# Patient Record
Sex: Male | Born: 1995 | Race: White | Hispanic: No | Marital: Single | Smoking: Never smoker
Health system: Southern US, Community
[De-identification: ages and names within clinical notes are randomized; demographics above are authoritative.]

---
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
## 2017-08-01 DIAGNOSIS — H6091 Unspecified otitis externa, right ear: Secondary | ICD-10-CM | POA: Diagnosis not present

## 2017-12-01 ENCOUNTER — Encounter (HOSPITAL_COMMUNITY): Payer: Self-pay | Admitting: Emergency Medicine

## 2017-12-01 ENCOUNTER — Ambulatory Visit (HOSPITAL_COMMUNITY)
Admission: EM | Admit: 2017-12-01 | Discharge: 2017-12-01 | Disposition: A | Discharge status: Home / Self Care | Payer: 59 | Attending: Family Medicine | Admitting: Family Medicine

## 2017-12-01 DIAGNOSIS — S0501XA Injury of conjunctiva and corneal abrasion without foreign body, right eye, initial encounter: Secondary | ICD-10-CM

## 2017-12-01 MED ORDER — OFLOXACIN 0.3 % OP SOLN: OPHTHALMIC | 0 refills | Status: AC

## 2017-12-01 MED ORDER — OFLOXACIN 0.3 % OP SOLN: OPHTHALMIC | 0 refills | Status: DC

## 2017-12-01 MED ORDER — POLYETHYL GLYCOL-PROPYL GLYCOL 0.4-0.3 % OP GEL: 1.0000 "application " | Freq: Every evening | OPHTHALMIC | 0 refills | Status: DC | PRN

## 2017-12-01 MED ORDER — POLYETHYL GLYCOL-PROPYL GLYCOL 0.4-0.3 % OP GEL: 1.0000 "application " | Freq: Every evening | OPHTHALMIC | 0 refills | Status: AC | PRN

## 2017-12-01 MED ORDER — TETRACAINE HCL 0.5 % OP SOLN
OPHTHALMIC | Status: AC
  Filled 2017-12-01: qty 4

## 2017-12-01 NOTE — Discharge Instructions (Signed)
Corneal abrasion to the bottom of the eye. Use ofloxacin eyedrops as directed on right eye. Artificial tear gel at night. Wait 10-15 minutes between drops, always use artificial tear gel last, as it prevents drops from penetrating through. Warm compresses as directed. Monitor for any worsening of symptoms, changes in vision, sensitivity to light, eye swelling, painful eye movement, follow up with ophthalmology for further evaluation. If symptoms does not improving in 2-3 days, follow up with the ophthalmologist for reevaluation needed.

## 2017-12-01 NOTE — ED Triage Notes (Signed)
Pt was grinding a piece of metal on his car and felt like something flew into his R eye.

## 2017-12-01 NOTE — ED Provider Notes (Signed)
[facility]    CSN: [id number] Arrival date & time: 12/01/17  1844     History   Chief Complaint Chief Complaint  Patient presents with  . Foreign Body in Eye    HPI Samuel Price is a 22 y.o. male.   22 year old male comes in with mother for possible foreign body in the right eye.  States he was working on the car, grinding a piece of metal when he felt like something flew into his right eye.  States he was wearing protective eyewear at the time, but states it could have been from underneath the protective eyewear.  Wrist eye out with tap water, OTC eyedrops, but still with foreign body sensation.  Eye watering and irritated.  Denies vision changes, photophobia.  Denies contact lens, glasses wear.     History reviewed. No pertinent past medical history.  There are no active problems to display for this patient.   History reviewed. No pertinent surgical history.     Home Medications    Prior to Admission medications   Medication Sig Start Date End Date Taking? Authorizing Provider  ofloxacin (OCUFLOX) 0.3 % ophthalmic solution 1 drop in right eye every 4 hours for the first 2 days, then 4 times a day for the next 5 days. 12/01/17   Cathie HoopsYu, Nishan Ovens V, PA-C  Polyethyl Glycol-Propyl Glycol (SYSTANE) 0.4-0.3 % GEL ophthalmic gel Place 1 application into both eyes at bedtime as needed. 12/01/17   Belinda FisherYu, Tonita Bills V, PA-C    Family History No family history on file.  Social History Social History   Tobacco Use  . Smoking status: Not on file  Substance Use Topics  . Alcohol use: Not on file  . Drug use: Not on file     Allergies   Patient has no known allergies.   Review of Systems Review of Systems  Reason unable to perform ROS: See HPI as above.     Physical Exam Triage Vital Signs ED Triage Vitals [12/01/17 1902]  Enc Vitals Group     BP (!) 122/58     Pulse Rate 65     Resp 16     Temp 98.4 F (36.9 C)     Temp Source Oral     SpO2 100 %     Weight     Height      Head Circumference      Peak Flow      Pain Score      Pain Loc      Pain Edu?      Excl. in GC?    No data found.  Updated Vital Signs BP (!) 122/58 (BP Location: Right Arm)   Pulse 65   Temp 98.4 F (36.9 C) (Oral)   Resp 16   SpO2 100%   Visual Acuity Right Eye Distance:   Left Eye Distance:   Bilateral Distance:    Right Eye Near: R Near: 20/20 Left Eye Near:  L Near: 20/20 Bilateral Near:  20/20  Physical Exam  Constitutional: He is oriented to person, place, and time. He appears well-developed and well-nourished. No distress.  HENT:  Head: Normocephalic and atraumatic.  Eyes: Pupils are equal, round, and reactive to light. EOM and lids are normal. Lids are everted and swept, no foreign bodies found. No foreign body present in the right eye. No foreign body present in the left eye. Right conjunctiva is not injected. Left conjunctiva is not injected.  Fluorescein stain with uptake  to the 6:00 region inferior to the iris approximately, 3 mm.  No foreign body seen.  Neck: Normal range of motion. Neck supple.  Neurological: He is alert and oriented to person, place, and time.  Skin: Skin is warm and dry.     UC Treatments / Results  Labs (all labs ordered are listed, but only abnormal results are displayed) Labs Reviewed - No data to display  EKG None  Radiology No results found.  Procedures Procedures (including critical care time)  Medications Ordered in UC Medications - No data to display  Initial Impression / Assessment and Plan / UC Course  I have reviewed the triage vital signs and the nursing notes.  Pertinent labs & imaging results that were available during my care of the patient were reviewed by me and considered in my medical decision making (see chart for details).    Start ofloxacin as directed.  Artificial tear gel as directed.  Return precautions given.  Patient to follow-up with ophthalmology for reevaluation if symptoms not  improving in 2 to 3 days.  Mother and patient expresses understanding and agrees to plan.  Final Clinical Impressions(s) / UC Diagnoses   Final diagnoses:  Abrasion of right cornea, initial encounter    ED Prescriptions    Medication Sig Dispense Auth. Provider   ofloxacin (OCUFLOX) 0.3 % ophthalmic solution  (Status: Discontinued) 1 drop in right eye every 4 hours for the first 2 days, then 4 times a day for the next 5 days. 5 mL Alyus Mofield V, PA-C   Polyethyl Glycol-Propyl Glycol (SYSTANE) 0.4-0.3 % GEL ophthalmic gel  (Status: Discontinued) Place 1 application into both eyes at bedtime as needed. 1 Bottle Priyanka Causey V, PA-C   ofloxacin (OCUFLOX) 0.3 % ophthalmic solution 1 drop in right eye every 4 hours for the first 2 days, then 4 times a day for the next 5 days. 5 mL Ettore Trebilcock V, PA-C   Polyethyl Glycol-Propyl Glycol (SYSTANE) 0.4-0.3 % GEL ophthalmic gel Place 1 application into both eyes at bedtime as needed. 1 Bottle Threasa Alpha, [place] 12/01/17 1930

## 2017-12-05 DIAGNOSIS — H6121 Impacted cerumen, right ear: Secondary | ICD-10-CM | POA: Diagnosis not present

## 2017-12-05 DIAGNOSIS — J3089 Other allergic rhinitis: Secondary | ICD-10-CM | POA: Diagnosis not present

## 2017-12-05 DIAGNOSIS — H9201 Otalgia, right ear: Secondary | ICD-10-CM | POA: Diagnosis not present

## 2018-02-04 DIAGNOSIS — Z13220 Encounter for screening for lipoid disorders: Secondary | ICD-10-CM | POA: Diagnosis not present

## 2018-02-04 DIAGNOSIS — Z0000 Encounter for general adult medical examination without abnormal findings: Secondary | ICD-10-CM | POA: Diagnosis not present

## 2018-02-04 DIAGNOSIS — Z131 Encounter for screening for diabetes mellitus: Secondary | ICD-10-CM | POA: Diagnosis not present

## 2018-08-08 DIAGNOSIS — J309 Allergic rhinitis, unspecified: Secondary | ICD-10-CM | POA: Diagnosis not present

## 2018-11-25 DIAGNOSIS — H60331 Swimmer's ear, right ear: Secondary | ICD-10-CM | POA: Diagnosis not present

## 2019-01-02 DIAGNOSIS — M8920 Other disorders of bone development and growth, unspecified site: Secondary | ICD-10-CM | POA: Diagnosis not present

## 2019-01-02 DIAGNOSIS — R6884 Jaw pain: Secondary | ICD-10-CM | POA: Diagnosis not present

## 2019-01-06 DIAGNOSIS — K030 Excessive attrition of teeth: Secondary | ICD-10-CM | POA: Diagnosis not present

## 2019-01-06 DIAGNOSIS — M2629 Other anomalies of dental arch relationship: Secondary | ICD-10-CM | POA: Diagnosis not present

## 2019-01-06 DIAGNOSIS — Z01812 Encounter for preprocedural laboratory examination: Secondary | ICD-10-CM | POA: Diagnosis not present

## 2019-01-06 DIAGNOSIS — M26623 Arthralgia of bilateral temporomandibular joint: Secondary | ICD-10-CM | POA: Diagnosis not present

## 2019-01-06 DIAGNOSIS — Z1159 Encounter for screening for other viral diseases: Secondary | ICD-10-CM | POA: Diagnosis not present

## 2019-01-06 DIAGNOSIS — R4702 Dysphasia: Secondary | ICD-10-CM | POA: Diagnosis not present

## 2019-01-06 DIAGNOSIS — M26213 Malocclusion, Angle's class III: Secondary | ICD-10-CM | POA: Diagnosis not present

## 2019-01-12 DIAGNOSIS — M26609 Unspecified temporomandibular joint disorder, unspecified side: Secondary | ICD-10-CM | POA: Diagnosis not present

## 2019-01-12 DIAGNOSIS — M2629 Other anomalies of dental arch relationship: Secondary | ICD-10-CM | POA: Diagnosis not present

## 2019-01-12 DIAGNOSIS — J342 Deviated nasal septum: Secondary | ICD-10-CM | POA: Diagnosis not present

## 2019-01-12 DIAGNOSIS — J343 Hypertrophy of nasal turbinates: Secondary | ICD-10-CM | POA: Diagnosis not present

## 2019-01-12 DIAGNOSIS — R4702 Dysphasia: Secondary | ICD-10-CM | POA: Diagnosis not present

## 2019-01-12 DIAGNOSIS — M26623 Arthralgia of bilateral temporomandibular joint: Secondary | ICD-10-CM | POA: Diagnosis not present

## 2019-01-12 DIAGNOSIS — M2602 Maxillary hypoplasia: Secondary | ICD-10-CM | POA: Diagnosis not present

## 2019-01-12 DIAGNOSIS — M26603 Bilateral temporomandibular joint disorder, unspecified: Secondary | ICD-10-CM | POA: Diagnosis not present

## 2019-02-04 DIAGNOSIS — Z0000 Encounter for general adult medical examination without abnormal findings: Secondary | ICD-10-CM | POA: Diagnosis not present

## 2019-03-23 DIAGNOSIS — J309 Allergic rhinitis, unspecified: Secondary | ICD-10-CM | POA: Diagnosis not present

## 2019-04-29 DIAGNOSIS — S0500XA Injury of conjunctiva and corneal abrasion without foreign body, unspecified eye, initial encounter: Secondary | ICD-10-CM | POA: Diagnosis not present

## 2019-05-07 DIAGNOSIS — H6121 Impacted cerumen, right ear: Secondary | ICD-10-CM | POA: Diagnosis not present

## 2019-05-07 DIAGNOSIS — H9201 Otalgia, right ear: Secondary | ICD-10-CM | POA: Diagnosis not present

## 2019-05-07 DIAGNOSIS — Z7289 Other problems related to lifestyle: Secondary | ICD-10-CM | POA: Diagnosis not present

## 2021-08-11 ENCOUNTER — Encounter (HOSPITAL_COMMUNITY): Payer: Self-pay | Admitting: Emergency Medicine

## 2021-08-11 ENCOUNTER — Emergency Department (HOSPITAL_COMMUNITY): Payer: Commercial Managed Care - HMO

## 2021-08-11 ENCOUNTER — Emergency Department (HOSPITAL_COMMUNITY)
Admission: EM | Admit: 2021-08-11 | Discharge: 2021-08-11 | Disposition: A | Discharge status: Home / Self Care | Payer: Commercial Managed Care - HMO | Attending: Emergency Medicine | Admitting: Emergency Medicine

## 2021-08-11 DIAGNOSIS — R079 Chest pain, unspecified: Secondary | ICD-10-CM | POA: Insufficient documentation

## 2021-08-11 DIAGNOSIS — R0981 Nasal congestion: Secondary | ICD-10-CM | POA: Insufficient documentation

## 2021-08-11 DIAGNOSIS — R0982 Postnasal drip: Secondary | ICD-10-CM | POA: Insufficient documentation

## 2021-08-11 DIAGNOSIS — Z20822 Contact with and (suspected) exposure to covid-19: Secondary | ICD-10-CM | POA: Insufficient documentation

## 2021-08-11 DIAGNOSIS — J40 Bronchitis, not specified as acute or chronic: Secondary | ICD-10-CM | POA: Insufficient documentation

## 2021-08-11 DIAGNOSIS — R0602 Shortness of breath: Secondary | ICD-10-CM | POA: Insufficient documentation

## 2021-08-11 DIAGNOSIS — R059 Cough, unspecified: Secondary | ICD-10-CM | POA: Diagnosis present

## 2021-08-11 DIAGNOSIS — J329 Chronic sinusitis, unspecified: Secondary | ICD-10-CM | POA: Insufficient documentation

## 2021-08-11 LAB — HEPATIC FUNCTION PANEL
ALT: 17 U/L (ref 0–44)
AST: 19 U/L (ref 15–41)
Albumin: 4.5 g/dL (ref 3.5–5.0)
Alkaline Phosphatase: 63 U/L (ref 38–126)
Bilirubin, Direct: <0.1 mg/dL (ref 0.0–0.2)
Total Bilirubin: 0.5 mg/dL (ref 0.3–1.2)
Total Protein: 7.5 g/dL (ref 6.5–8.1)

## 2021-08-11 LAB — RESPIRATORY PANEL BY PCR

## 2021-08-11 LAB — CBC
HCT: 43.6 % (ref 39.0–52.0)
Hemoglobin: 14.2 g/dL (ref 13.0–17.0)
MCH: 28.6 pg (ref 26.0–34.0)
MCHC: 32.6 g/dL (ref 30.0–36.0)
MCV: 87.7 fL (ref 80.0–100.0)
Platelets: 188 10*3/uL (ref 150–400)
RBC: 4.97 MIL/uL (ref 4.22–5.81)
RDW: 12.6 % (ref 11.5–15.5)
WBC: 5 10*3/uL (ref 4.0–10.5)
nRBC: 0 % (ref 0.0–0.2)

## 2021-08-11 LAB — BASIC METABOLIC PANEL
Anion gap: 10 (ref 5–15)
BUN: 13 mg/dL (ref 6–20)
CO2: 23 mmol/L (ref 22–32)
Calcium: 9.4 mg/dL (ref 8.9–10.3)
Chloride: 103 mmol/L (ref 98–111)
Creatinine, Ser: 0.99 mg/dL (ref 0.61–1.24)
GFR, Estimated: >60 mL/min (ref >60)
Glucose, Bld: 105 mg/dL — ABNORMAL HIGH (ref 70–99)
Potassium: 4.1 mmol/L (ref 3.5–5.1)
Sodium: 136 mmol/L (ref 135–145)

## 2021-08-11 LAB — RESP PANEL BY RT-PCR (FLU A&B, COVID) ARPGX2
Influenza A by PCR: NEGATIVE
Influenza B by PCR: NEGATIVE
SARS Coronavirus 2 by RT PCR: NEGATIVE

## 2021-08-11 LAB — LIPASE, BLOOD: Lipase: 43 U/L (ref 11–51)

## 2021-08-11 LAB — TROPONIN I (HIGH SENSITIVITY)
Troponin I (High Sensitivity): 4 ng/L (ref <18)
Troponin I (High Sensitivity): 3 ng/L (ref <18)

## 2021-08-11 MED ORDER — ALBUTEROL SULFATE HFA 108 (90 BASE) MCG/ACT IN AERS
2.0000 | INHALATION_SPRAY | Freq: Once | RESPIRATORY_TRACT | Status: AC
  Administered 2021-08-11: 2 via RESPIRATORY_TRACT
  Filled 2021-08-11: qty 6.7

## 2021-08-11 NOTE — Discharge Instructions (Signed)
You likely have bronchitis.  Please use albuterol 2 puffs every 4 hours as needed for cough and shortness of breath  See your doctor for follow-up  We swabbed you for respiratory viral panel that can take several days to come back  Return to ER if you have worse shortness of breath, chest pain.

## 2021-08-11 NOTE — ED Notes (Signed)
Pt verbalized understanding of d/c instructions, meds, and followup care. Denies questions. VSS, no distress noted. Steady gait to exit with all belongings.  ?

## 2021-08-11 NOTE — ED Provider Notes (Signed)
[facility] Provider Note   CSN: [id number] Arrival date & time: 08/11/21  1057     History  No chief complaint on file.   Samuel Price is a 26 y.o. male here presenting with persistent cough, postnasal drip, chest pain.  Patient states that symptoms going on for about 3 weeks.  Patient started has some nasal congestion and postnasal drip.  Patient states that he has been out of her chest pressure and some subjective shortness of breath for several weeks now.  Patient was just started on amoxicillin for sinusitis  The history is provided by the patient.      Home Medications Prior to Admission medications   Medication Sig Start Date End Date Taking? Authorizing Provider  ofloxacin (OCUFLOX) 0.3 % ophthalmic solution 1 drop in right eye every 4 hours for the first 2 days, then 4 times a day for the next 5 days. 12/01/17   Tasia Catchings, Amy V, PA-C  Polyethyl Glycol-Propyl Glycol (SYSTANE) 0.4-0.3 % GEL ophthalmic gel Place 1 application into both eyes at bedtime as needed. 12/01/17   Ok Edwards, PA-C      Allergies    Patient has no known allergies.    Review of Systems   Review of Systems  Respiratory:  Positive for shortness of breath.   All other systems reviewed and are negative.  Physical Exam Updated Vital Signs BP 122/79 (BP Location: Right Arm)    Pulse 66    Temp (!) 97.5 F (36.4 C) (Oral)    Resp 19    Ht 6' (1.829 m)    Wt 88.9 kg    SpO2 99%    BMI 26.58 kg/m  Physical Exam Vitals and nursing note reviewed.  Constitutional:      Appearance: Normal appearance.  HENT:     Head: Normocephalic.     Nose: Nose normal.     Mouth/Throat:     Mouth: Mucous membranes are moist.  Eyes:     Extraocular Movements: Extraocular movements intact.     Pupils: Pupils are equal, round, and reactive to light.  Cardiovascular:     Rate and Rhythm: Normal rate and regular rhythm.     Pulses: Normal pulses.     Heart sounds: Normal heart sounds.   Pulmonary:     Effort: Pulmonary effort is normal.     Comments: Diminished bilaterally.  No obvious wheezing Abdominal:     General: Abdomen is flat.     Palpations: Abdomen is soft.  Musculoskeletal:        General: Normal range of motion.     Cervical back: Normal range of motion and neck supple.  Skin:    General: Skin is warm.     Capillary Refill: Capillary refill takes less than 2 seconds.  Neurological:     General: No focal deficit present.     Mental Status: He is alert and oriented to person, place, and time.  Psychiatric:        Mood and Affect: Mood normal.        Behavior: Behavior normal.    ED Results / Procedures / Treatments   Labs (all labs ordered are listed, but only abnormal results are displayed) Labs Reviewed  BASIC METABOLIC PANEL - Abnormal; Notable for the following components:      Result Value   Glucose, Bld 105 (*)    All other components within normal limits  RESP PANEL BY RT-PCR (FLU A&B, COVID)  ARPGX2  RESPIRATORY PANEL BY PCR  CBC  LIPASE, BLOOD  HEPATIC FUNCTION PANEL  TROPONIN I (HIGH SENSITIVITY)  TROPONIN I (HIGH SENSITIVITY)    EKG EKG Interpretation  Date/Time:  Friday August 11 2021 11:22:47 EST Ventricular Rate:  69 PR Interval:  144 QRS Duration: 90 QT Interval:  378 QTC Calculation: 405 R Axis:   76 Text Interpretation: Normal sinus rhythm Normal ECG When compared with ECG of 23-Feb-2008 08:31, PREVIOUS ECG IS PRESENT No significant change since last tracing Confirmed by Wandra Arthurs [phone]) on 08/11/2021 5:23:54 PM  Radiology DG Chest 1 View  Result Date: 08/11/2021 CLINICAL DATA:  Chest tightness. EXAM: CHEST  1 VIEW COMPARISON:  None. FINDINGS: No consolidation. No visible pleural effusions or pneumothorax. Cardiomediastinal silhouette is within normal limits. No evidence of acute osseous abnormality. IMPRESSION: No evidence of acute cardiopulmonary disease. Electronically Signed   By: Margaretha Sheffield M.D.   On:  08/11/2021 11:52    Procedures Procedures    Medications Ordered in ED Medications  albuterol (VENTOLIN HFA) 108 (90 Base) MCG/ACT inhaler 2 puff (2 puffs Inhalation Given 08/11/21 1749)    ED Course/ Medical Decision Making/ A&P                           Medical Decision Making KENDYN PALER is a 26 y.o. male here presenting with chest pain and shortness of breath for several weeks.  Patient has viral syndrome and chills for several weeks.  Patient sent in for rule out ACS. Patient is low risk for ACS.  I think likely bronchitis.  Plan to get CBC and CMP and COVID test flu test and chest x-ray and troponin x 2.   6:01 PM Labs unremarkable.  Troponin negative x2. CXR clear.  COVID test is negative.  Given albuterol for bronchitis.  Patient is swabbed for respiratory panel.    Problems Addressed: Chest pain: acute illness or injury  Amount and/or Complexity of Data Reviewed Labs: ordered. Decision-making details documented in ED Course. Radiology: ordered and independent interpretation performed. Decision-making details documented in ED Course. ECG/medicine tests: ordered and independent interpretation performed. Decision-making details documented in ED Course.  Risk Prescription drug management.  Final Clinical Impression(s) / ED Diagnoses Final diagnoses:  Chest pain    Rx / DC Orders ED Discharge Orders     None         Drenda Freeze, MD 08/11/21 [phone]

## 2021-08-11 NOTE — ED Triage Notes (Signed)
Pt here with c/o chest pain sent drom md office for an abnormal ekg , pt has not feeling well for about  3 weeks and started on antibiotics for a sinus infection

## 2021-08-11 NOTE — ED Provider Triage Note (Signed)
Emergency Medicine Provider Triage Evaluation Note  Samuel Price , a 26 y.o. male  was evaluated in triage.  Pt complains of chest pain.  Patient states that about 3 weeks ago he started developing some nasal congestion.  The symptoms were persistent.  He was seen by his primary care doctor about 2 days ago and was placed on amoxicillin for a sinus infection.  Patient states that on Monday, he started noticing some chest pain.  He said the chest pains in the center of his chest.  It does not radiate.  It feels throbbing.  It tends to come and go.  Episodes last about 10 to 20 minutes and then they get better.  It is not associated with exertion as patient is mostly been lying in bed for the past 3 weeks.  He says that he does have associated shortness of breath with the chest pain.  He has not had any fevers.  He presented again to his primary care provider today.  They obtained his EKG at that time, and apparently was told to have some EKG changes.  He was sent over here to be evaluated for pericarditis given recent viral infection versus ACS.  Review of Systems  Positive: Chest pain, shortness of breath Negative:   Physical Exam  BP 129/82 (BP Location: Right Arm)    Pulse 68    Temp 98.9 F (37.2 C) (Oral)    Resp 20    SpO2 99%  Gen:   Awake, no distress   Resp:  Normal effort  MSK:   Moves extremities without difficulty  Other:    Medical Decision Making  Medically screening exam initiated at 11:31 AM.  Appropriate orders placed.  Samuel Price was informed that the remainder of the evaluation will be completed by another provider, this initial triage assessment does not replace that evaluation, and the importance of remaining in the ED until their evaluation is complete.  CP workup initiated   Claudie Leach, PA-C 08/11/21 1134

## 2022-06-27 ENCOUNTER — Other Ambulatory Visit (HOSPITAL_BASED_OUTPATIENT_CLINIC_OR_DEPARTMENT_OTHER): Payer: Self-pay

## 2022-06-27 DIAGNOSIS — J0100 Acute maxillary sinusitis, unspecified: Secondary | ICD-10-CM | POA: Diagnosis not present

## 2022-06-27 MED ORDER — AMOXICILLIN-POT CLAVULANATE 875-125 MG PO TABS
1.0000 | ORAL_TABLET | Freq: Two times a day (BID) | ORAL | 0 refills | Status: AC
  Filled 2022-06-27: qty 20, 10d supply, fill #0

## 2022-11-16 ENCOUNTER — Other Ambulatory Visit (HOSPITAL_BASED_OUTPATIENT_CLINIC_OR_DEPARTMENT_OTHER): Payer: Self-pay

## 2022-11-16 ENCOUNTER — Ambulatory Visit
Admission: RE | Admit: 2022-11-16 | Discharge: 2022-11-16 | Disposition: A | Discharge status: Home / Self Care | Payer: 59 | Source: Ambulatory Visit | Attending: Urgent Care | Admitting: Urgent Care

## 2022-11-16 VITALS — BP 104/66 | HR 89 | Temp 98.8°F | Resp 20

## 2022-11-16 DIAGNOSIS — J309 Allergic rhinitis, unspecified: Secondary | ICD-10-CM

## 2022-11-16 DIAGNOSIS — B9789 Other viral agents as the cause of diseases classified elsewhere: Secondary | ICD-10-CM | POA: Diagnosis not present

## 2022-11-16 DIAGNOSIS — J988 Other specified respiratory disorders: Secondary | ICD-10-CM

## 2022-11-16 MED ORDER — IBUPROFEN 600 MG PO TABS
600.0000 mg | ORAL_TABLET | Freq: Four times a day (QID) | ORAL | 0 refills | Status: AC | PRN
  Filled 2022-11-16: qty 30, 8d supply, fill #0

## 2022-11-16 MED ORDER — CETIRIZINE HCL 10 MG PO TABS
10.0000 mg | ORAL_TABLET | Freq: Every day | ORAL | 0 refills | Status: AC
  Filled 2022-11-16: qty 30, 30d supply, fill #0

## 2022-11-16 MED ORDER — PSEUDOEPHEDRINE HCL 60 MG PO TABS
60.0000 mg | ORAL_TABLET | Freq: Three times a day (TID) | ORAL | 0 refills | Status: AC | PRN
  Filled 2022-11-16: qty 30, 10d supply, fill #0

## 2022-11-16 NOTE — Discharge Instructions (Signed)
We will manage this as a viral illness. For sore throat or cough try using a honey-based tea. Use 3 teaspoons of honey with juice squeezed from half lemon. Place shaved pieces of ginger into 1/2-1 cup of water and warm over stove top. Then mix the ingredients and repeat every 4 hours as needed. Please take ibuprofen 600mg every 6 hours with food alternating with OR taken together with Tylenol 500mg-650mg every 6 hours for throat pain, fevers, aches and pains. Hydrate very well with at least 2 liters of water. Eat light meals such as soups (chicken and noodles, vegetable, chicken and wild rice).  Do not eat foods that you are allergic to.  Taking an antihistamine like Zyrtec (10mg daily) can help against postnasal drainage, sinus congestion which can cause sinus pain, sinus headaches, throat pain, painful swallowing, coughing.  You can take this together with pseudoephedrine (Sudafed) at a dose of 60 mg 3 times a day or twice daily as needed for the same kind of nasal drip, congestion.  

## 2022-11-16 NOTE — ED Provider Notes (Addendum)
[facility]  Note:  This document was prepared using Conservation officer, historic buildings and may include unintentional dictation errors.  MRN: [id number] DOB: 08/14/1995  Subjective:   Samuel Price is a 27 y.o. male presenting for 4 day history of acute onset sinus congestion, fever, worse in the past 2 days. Has a scratchy throat, body aches. Denies cough, chest pain, shob, wheezing, rashes. No asthma. No smoking of any kind including cigarettes, cigars, vaping, marijuana use.  Did a COVID test at home, was negative.   No current facility-administered medications for this encounter.  Current Outpatient Medications:    ofloxacin (OCUFLOX) 0.3 % ophthalmic solution, 1 drop in right eye every 4 hours for the first 2 days, then 4 times a day for the next 5 days., Disp: 5 mL, Rfl: 0   Polyethyl Glycol-Propyl Glycol (SYSTANE) 0.4-0.3 % GEL ophthalmic gel, Place 1 application into both eyes at bedtime as needed., Disp: 1 Bottle, Rfl: 0   No Known Allergies  History reviewed. No pertinent past medical history.   History reviewed. No pertinent surgical history.  No family history on file.  Social History   Tobacco Use   Smoking status: Never   Smokeless tobacco: Never  Vaping Use   Vaping Use: Never used  Substance Use Topics   Alcohol use: Yes    Comment: occ   Drug use: Never    ROS   Objective:   Vitals: BP 104/66 (BP Location: Right Arm)   Pulse 89   Temp 98.8 F (37.1 C)   Resp 20   SpO2 97%   Physical Exam Constitutional:      General: He is not in acute distress.    Appearance: Normal appearance. He is well-developed and normal weight. He is not ill-appearing, toxic-appearing or diaphoretic.  HENT:     Head: Normocephalic and atraumatic.     Right Ear: Tympanic membrane, ear canal and external ear normal. No drainage, swelling or tenderness. No middle ear effusion. There is no impacted cerumen. Tympanic membrane is not erythematous or  bulging.     Left Ear: Tympanic membrane, ear canal and external ear normal. No drainage, swelling or tenderness.  No middle ear effusion. There is no impacted cerumen. Tympanic membrane is not erythematous or bulging.     Nose: Congestion present. No rhinorrhea.     Mouth/Throat:     Mouth: Mucous membranes are moist.     Pharynx: No oropharyngeal exudate or posterior oropharyngeal erythema.  Eyes:     General: No scleral icterus.       Right eye: No discharge.        Left eye: No discharge.     Extraocular Movements: Extraocular movements intact.     Conjunctiva/sclera: Conjunctivae normal.  Cardiovascular:     Rate and Rhythm: Normal rate and regular rhythm.     Heart sounds: Normal heart sounds. No murmur heard.    No friction rub. No gallop.  Pulmonary:     Effort: Pulmonary effort is normal. No respiratory distress.     Breath sounds: Normal breath sounds. No stridor. No wheezing, rhonchi or rales.  Musculoskeletal:     Cervical back: Normal range of motion and neck supple. No rigidity. No muscular tenderness.  Neurological:     General: No focal deficit present.     Mental Status: He is alert and oriented to person, place, and time.  Psychiatric:        Mood and Affect: Mood  normal.        Behavior: Behavior normal.        Thought Content: Thought content normal.     Assessment and Plan :   PDMP not reviewed this encounter.  1. Viral respiratory illness   2. Allergic rhinitis, unspecified seasonality, unspecified trigger    Declined a COVID test.  Deferred imaging given clear cardiopulmonary exam, hemodynamically stable vital signs. Suspect viral URI, viral syndrome. Physical exam findings reassuring and vital signs stable for discharge. Advised supportive care, offered symptomatic relief. Counseled patient on potential for adverse effects with medications prescribed/recommended today, ER and return-to-clinic precautions discussed, patient verbalized understanding.      Wallis Bamberg, [place] 11/16/22 1408

## 2022-11-16 NOTE — ED Triage Notes (Signed)
Pt c/o head congestion x 4 days-fever last night-last dose tylenol ~9am-NAD-steady gait

## 2023-02-01 ENCOUNTER — Other Ambulatory Visit (HOSPITAL_BASED_OUTPATIENT_CLINIC_OR_DEPARTMENT_OTHER): Payer: Self-pay

## 2023-02-01 DIAGNOSIS — R590 Localized enlarged lymph nodes: Secondary | ICD-10-CM | POA: Diagnosis not present

## 2023-02-01 MED ORDER — AMOXICILLIN 875 MG PO TABS
875.0000 mg | ORAL_TABLET | Freq: Two times a day (BID) | ORAL | 0 refills | Status: AC
  Filled 2023-02-01: qty 14, 7d supply, fill #0

## 2023-02-05 ENCOUNTER — Other Ambulatory Visit (HOSPITAL_BASED_OUTPATIENT_CLINIC_OR_DEPARTMENT_OTHER): Payer: Self-pay

## 2023-03-20 DIAGNOSIS — Z13220 Encounter for screening for lipoid disorders: Secondary | ICD-10-CM | POA: Diagnosis not present

## 2023-03-20 DIAGNOSIS — Z0000 Encounter for general adult medical examination without abnormal findings: Secondary | ICD-10-CM | POA: Diagnosis not present

## 2023-03-20 DIAGNOSIS — H6593 Unspecified nonsuppurative otitis media, bilateral: Secondary | ICD-10-CM | POA: Diagnosis not present

## 2023-03-20 DIAGNOSIS — Z9109 Other allergy status, other than to drugs and biological substances: Secondary | ICD-10-CM | POA: Diagnosis not present

## 2023-05-21 ENCOUNTER — Other Ambulatory Visit (HOSPITAL_BASED_OUTPATIENT_CLINIC_OR_DEPARTMENT_OTHER): Payer: Self-pay

## 2023-05-21 ENCOUNTER — Ambulatory Visit
Admission: EM | Admit: 2023-05-21 | Discharge: 2023-05-21 | Disposition: A | Discharge status: Home / Self Care | Payer: 59 | Attending: Internal Medicine | Admitting: Internal Medicine

## 2023-05-21 DIAGNOSIS — B349 Viral infection, unspecified: Secondary | ICD-10-CM | POA: Diagnosis not present

## 2023-05-21 DIAGNOSIS — R051 Acute cough: Secondary | ICD-10-CM | POA: Diagnosis not present

## 2023-05-21 DIAGNOSIS — J029 Acute pharyngitis, unspecified: Secondary | ICD-10-CM | POA: Insufficient documentation

## 2023-05-21 LAB — POC COVID19/FLU A&B COMBO
Covid Antigen, POC: NEGATIVE
Influenza A Antigen, POC: NEGATIVE
Influenza B Antigen, POC: NEGATIVE

## 2023-05-21 LAB — POCT RAPID STREP A (OFFICE): Rapid Strep A Screen: NEGATIVE

## 2023-05-21 MED ORDER — BENZONATATE 200 MG PO CAPS
200.0000 mg | ORAL_CAPSULE | Freq: Three times a day (TID) | ORAL | 0 refills | Status: AC | PRN
  Filled 2023-05-21: qty 20, 7d supply, fill #0

## 2023-05-21 NOTE — ED Triage Notes (Signed)
Pt presents with c/o nasal congestion, scratchy throat, bilateral ear pressure, X 3 days.   denies cough.   Home interventions- Nyquil/Dayquil

## 2023-05-21 NOTE — Discharge Instructions (Signed)
The clinic will contact you with results of the strep throat culture done today if positive.  You may take Tessalon as needed for cough.  Lots of rest and fluids.  Please follow-up with your PCP if your symptoms do not improve.  Please go to the ER for any worsening symptoms.  I hope you feel better soon!

## 2023-05-21 NOTE — ED Provider Notes (Signed)
[facility]    CSN: [id number] Arrival date & time: 05/21/23  1240      History   Chief Complaint Chief Complaint  Patient presents with   Nasal Congestion    HPI Samuel Price is a 27 y.o. male  presents for evaluation of URI symptoms for 3 days. Patient reports associated symptoms of cough, congestion, sore throat, ear pain. Denies N/V/D, fevers, body aches, shortness of breath. Patient does not have a hx of asthma. Patient does not have a history of smoking.   Pt has taken DayQuil/NyQuil OTC for symptoms. Pt has no other concerns at this time.   HPI  History reviewed. No pertinent past medical history.  There are no problems to display for this patient.   History reviewed. No pertinent surgical history.     Home Medications    Prior to Admission medications   Medication Sig Start Date End Date Taking? Authorizing Provider  benzonatate (TESSALON) 200 MG capsule Take 1 capsule (200 mg total) by mouth 3 (three) times daily as needed. 05/21/23  Yes Radford Pax, NP  amoxicillin (AMOXIL) 875 MG tablet Take 1 tablet (875 mg total) by mouth 2 (two) times daily. 02/01/23     cetirizine (ZYRTEC ALLERGY) 10 MG tablet Take 1 tablet (10 mg total) by mouth daily. 11/16/22   Wallis Bamberg, PA-C  ibuprofen (ADVIL) 600 MG tablet Take 1 tablet (600 mg total) by mouth every 6 (six) hours as needed. 11/16/22   Wallis Bamberg, PA-C  ofloxacin (OCUFLOX) 0.3 % ophthalmic solution 1 drop in right eye every 4 hours for the first 2 days, then 4 times a day for the next 5 days. 12/01/17   Cathie Hoops, Amy V, PA-C  Polyethyl Glycol-Propyl Glycol (SYSTANE) 0.4-0.3 % GEL ophthalmic gel Place 1 application into both eyes at bedtime as needed. 12/01/17   Cathie Hoops, Amy V, PA-C  pseudoephedrine (SUDAFED) 60 MG tablet Take 1 tablet (60 mg total) by mouth every 8 (eight) hours as needed for congestion. 11/16/22   Wallis Bamberg, PA-C    Family History History reviewed. No pertinent family history.  Social  History Social History   Tobacco Use   Smoking status: Never   Smokeless tobacco: Never  Vaping Use   Vaping status: Never Used  Substance Use Topics   Alcohol use: Yes    Comment: occ   Drug use: Never     Allergies   Patient has no known allergies.   Review of Systems Review of Systems  HENT:  Positive for congestion and sore throat.   Respiratory:  Positive for cough.      Physical Exam Triage Vital Signs ED Triage Vitals  Encounter Vitals Group     BP 05/21/23 1259 120/79     Systolic BP Percentile --      Diastolic BP Percentile --      Pulse Rate 05/21/23 1259 73     Resp 05/21/23 1259 17     Temp 05/21/23 1259 97.9 F (36.6 C)     Temp Source 05/21/23 1259 Oral     SpO2 05/21/23 1259 95 %     Weight --      Height --      Head Circumference --      Peak Flow --      Pain Score 05/21/23 1258 3     Pain Loc --      Pain Education --      Exclude from Growth Chart --  No data found.  Updated Vital Signs BP 120/79 (BP Location: Right Arm)   Pulse 73   Temp 97.9 F (36.6 C) (Oral)   Resp 17   SpO2 95%   Visual Acuity Right Eye Distance:   Left Eye Distance:   Bilateral Distance:    Right Eye Near:   Left Eye Near:    Bilateral Near:     Physical Exam Vitals and nursing note reviewed.  Constitutional:      General: He is not in acute distress.    Appearance: Normal appearance. He is not ill-appearing or toxic-appearing.  HENT:     Head: Normocephalic and atraumatic.     Right Ear: Tympanic membrane and ear canal normal.     Left Ear: Tympanic membrane and ear canal normal.     Nose: Congestion present.     Mouth/Throat:     Mouth: Mucous membranes are moist.     Pharynx: Posterior oropharyngeal erythema present.  Eyes:     Pupils: Pupils are equal, round, and reactive to light.  Cardiovascular:     Rate and Rhythm: Normal rate and regular rhythm.     Heart sounds: Normal heart sounds.  Pulmonary:     Effort: Pulmonary effort is  normal.     Breath sounds: Normal breath sounds.  Musculoskeletal:     Cervical back: Normal range of motion and neck supple.  Lymphadenopathy:     Cervical: No cervical adenopathy.  Skin:    General: Skin is warm and dry.  Neurological:     General: No focal deficit present.     Mental Status: He is alert and oriented to person, place, and time.  Psychiatric:        Mood and Affect: Mood normal.        Behavior: Behavior normal.      UC Treatments / Results  Labs (all labs ordered are listed, but only abnormal results are displayed) Labs Reviewed  CULTURE, GROUP A STREP [facility])  POCT RAPID STREP A (OFFICE)  POC COVID19/FLU A&B COMBO    EKG   Radiology No results found.  Procedures Procedures (including critical care time)  Medications Ordered in UC Medications - No data to display  Initial Impression / Assessment and Plan / UC Course  I have reviewed the triage vital signs and the nursing notes.  Pertinent labs & imaging results that were available during my care of the patient were reviewed by me and considered in my medical decision making (see chart for details).      Reviewed exam and symptoms with patient.  No red flags.  Negative rapid strep, flu, COVID testing will send strep throat culture and contact if positive.  Discussed viral illness and symptomatic treatment.  Tessalon as needed for cough.  PCP follow-up if symptoms do not improve.  ER precautions reviewed. Final Clinical Impressions(s) / UC Diagnoses   Final diagnoses:  Acute cough  Sore throat  Viral illness     Discharge Instructions      The clinic will contact you with results of the strep throat culture done today if positive.  You may take Tessalon as needed for cough.  Lots of rest and fluids.  Please follow-up with your PCP if your symptoms do not improve.  Please go to the ER for any worsening symptoms.  I hope you feel better soon!     ED Prescriptions     Medication Sig  Dispense Auth. Provider   benzonatate (TESSALON) 200  MG capsule Take 1 capsule (200 mg total) by mouth 3 (three) times daily as needed. 20 capsule Radford Pax, NP      PDMP not reviewed this encounter.   Radford Pax, NP 05/21/23 [phone]

## 2023-05-24 LAB — CULTURE, GROUP A STREP (THRC)

## 2023-07-25 IMAGING — DX DG CHEST 1V
2 series · 2 of 2 positions shown · non-contrast
Comparison: None.

CLINICAL DATA: Chest tightness.

EXAM:
CHEST  1 VIEW

[x chest ap (1 of 2)]
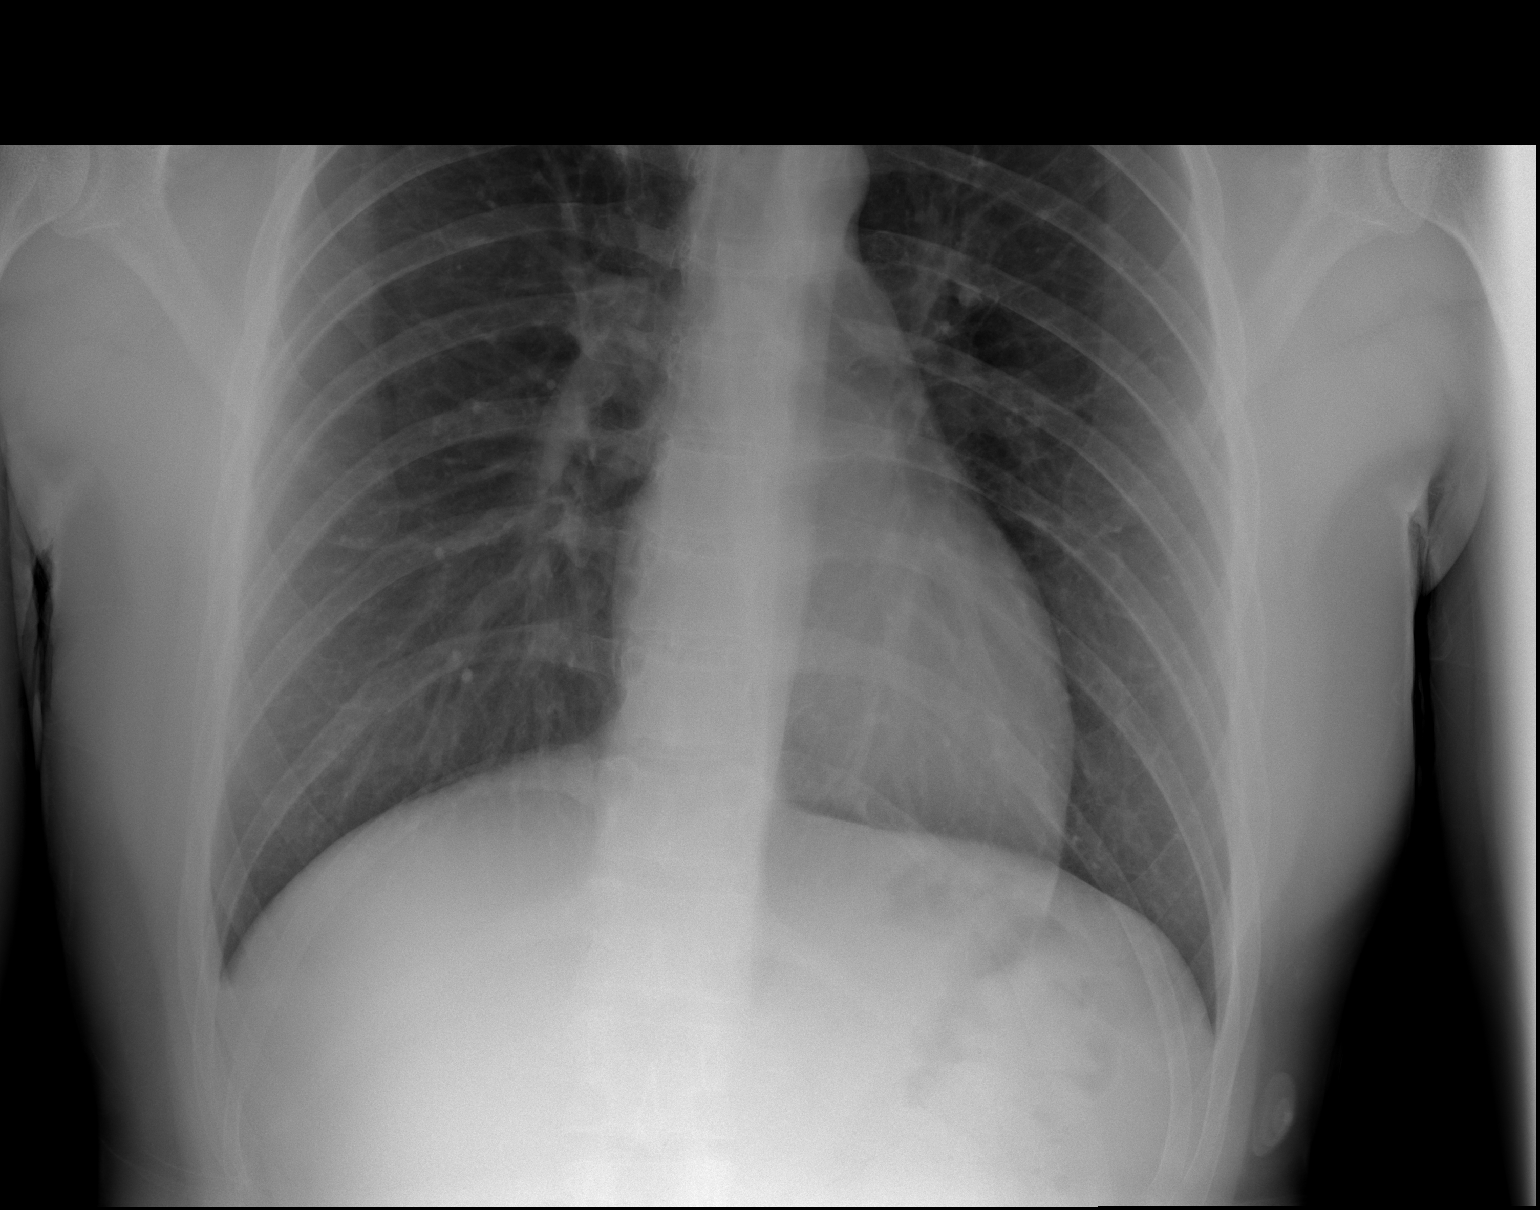

[x chest ap (2 of 2)]
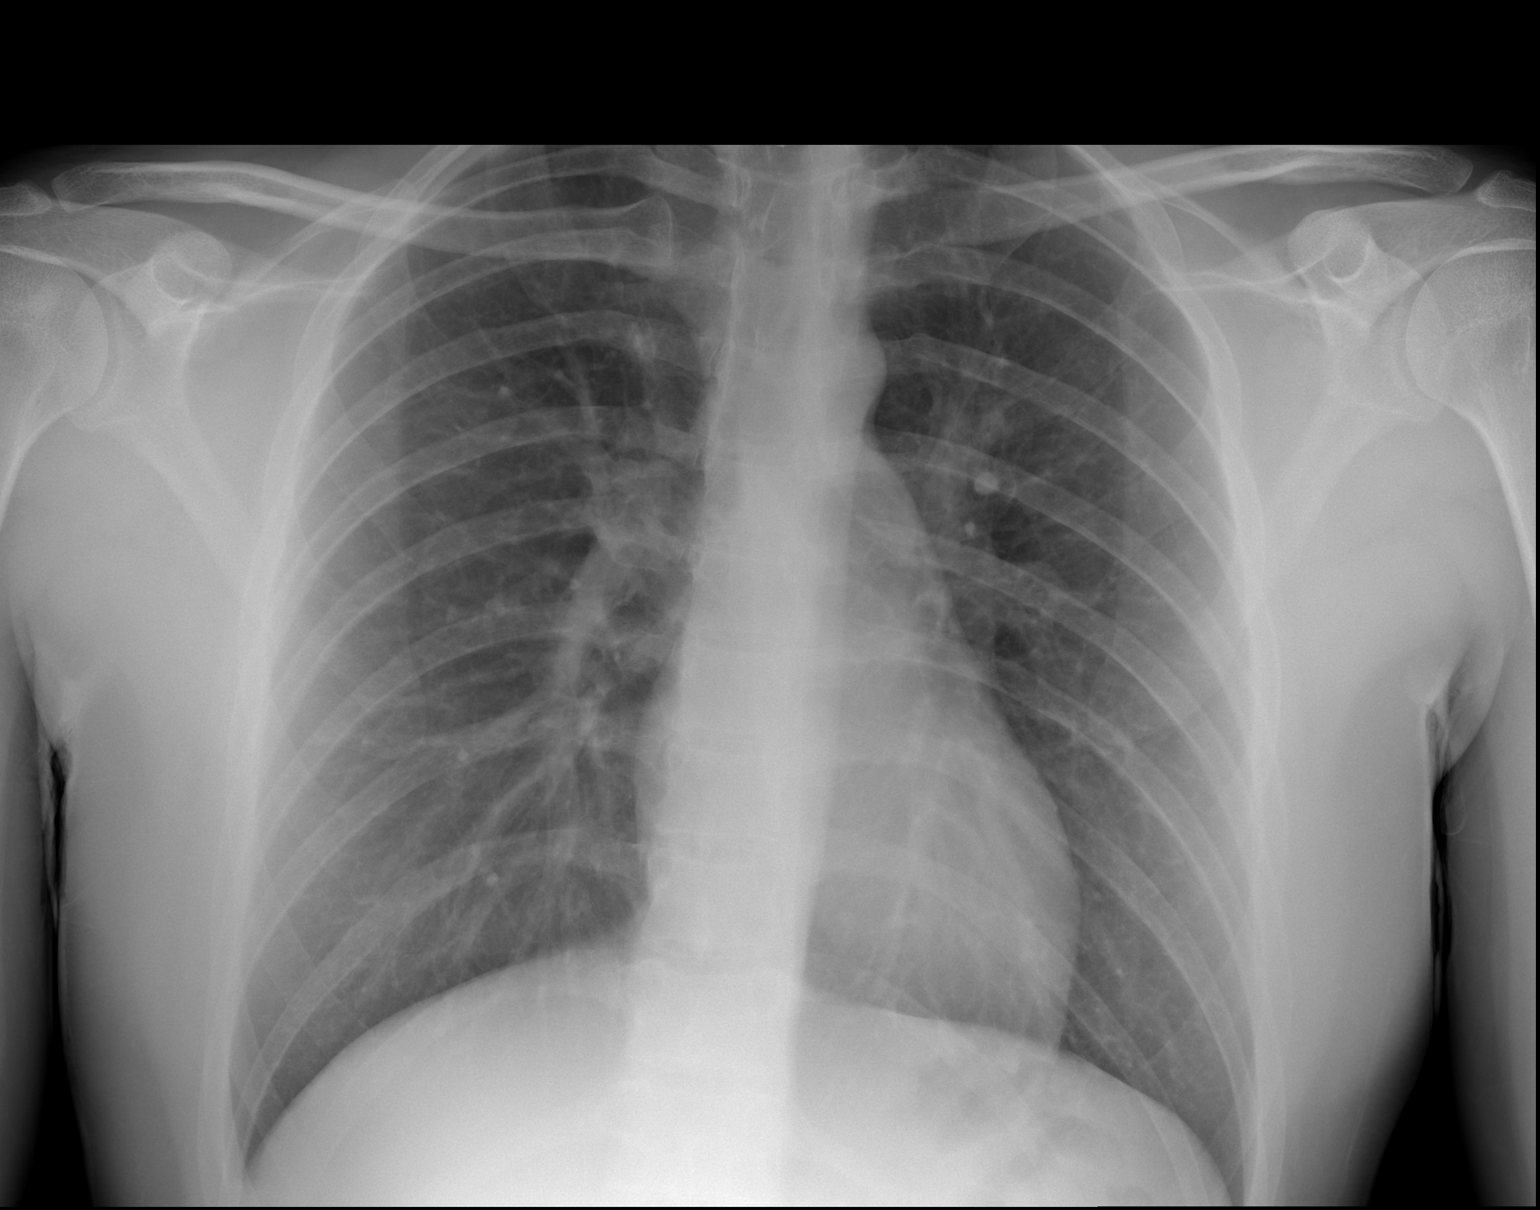

[2 of 2 positions shown; findings below may reference images not displayed]

FINDINGS: No consolidation. No visible pleural effusions or pneumothorax.
Cardiomediastinal silhouette is within normal limits. No evidence of
acute osseous abnormality.
IMPRESSION: No evidence of acute cardiopulmonary disease.

## 2024-03-19 DIAGNOSIS — M25561 Pain in right knee: Secondary | ICD-10-CM | POA: Diagnosis not present

## 2024-03-19 DIAGNOSIS — Z13220 Encounter for screening for lipoid disorders: Secondary | ICD-10-CM | POA: Diagnosis not present

## 2024-03-19 DIAGNOSIS — Z0000 Encounter for general adult medical examination without abnormal findings: Secondary | ICD-10-CM | POA: Diagnosis not present

## 2024-03-19 DIAGNOSIS — R7309 Other abnormal glucose: Secondary | ICD-10-CM | POA: Diagnosis not present

## 2024-03-19 DIAGNOSIS — Z9109 Other allergy status, other than to drugs and biological substances: Secondary | ICD-10-CM | POA: Diagnosis not present

## 2024-03-19 DIAGNOSIS — R635 Abnormal weight gain: Secondary | ICD-10-CM | POA: Diagnosis not present

## 2024-04-07 ENCOUNTER — Other Ambulatory Visit (HOSPITAL_BASED_OUTPATIENT_CLINIC_OR_DEPARTMENT_OTHER): Payer: Self-pay

## 2024-04-07 DIAGNOSIS — M25561 Pain in right knee: Secondary | ICD-10-CM | POA: Diagnosis not present

## 2024-04-07 MED ORDER — MELOXICAM 15 MG PO TABS
15.0000 mg | ORAL_TABLET | Freq: Every day | ORAL | 0 refills | Status: AC
  Filled 2024-04-07: qty 10, 10d supply, fill #0

## 2024-04-24 DIAGNOSIS — M25561 Pain in right knee: Secondary | ICD-10-CM | POA: Diagnosis not present

## 2024-05-01 DIAGNOSIS — M25561 Pain in right knee: Secondary | ICD-10-CM | POA: Diagnosis not present

## 2024-05-08 DIAGNOSIS — M25561 Pain in right knee: Secondary | ICD-10-CM | POA: Diagnosis not present

## 2024-05-15 DIAGNOSIS — M25561 Pain in right knee: Secondary | ICD-10-CM | POA: Diagnosis not present

## 2024-05-19 DIAGNOSIS — M25561 Pain in right knee: Secondary | ICD-10-CM | POA: Diagnosis not present
